# Patient Record
Sex: Female | Born: 1960 | Race: Black or African American | Hispanic: No | Marital: Married | Smoking: Never smoker
Health system: Southern US, Community
[De-identification: ages and names within clinical notes are randomized; demographics above are authoritative.]

## PROBLEM LIST (undated history)

## (undated) DIAGNOSIS — E119 Type 2 diabetes mellitus without complications: Secondary | ICD-10-CM

## (undated) DIAGNOSIS — I10 Essential (primary) hypertension: Secondary | ICD-10-CM

## (undated) HISTORY — PX: GASTROPLASTY DUODENAL SWITCH: SHX1699

---

## 2020-11-14 ENCOUNTER — Other Ambulatory Visit: Payer: Self-pay

## 2020-11-14 ENCOUNTER — Encounter: Payer: Self-pay | Admitting: Emergency Medicine

## 2020-11-14 ENCOUNTER — Ambulatory Visit
Admission: EM | Admit: 2020-11-14 | Discharge: 2020-11-14 | Disposition: A | Discharge status: Home / Self Care | Payer: BC Managed Care – PPO

## 2020-11-14 ENCOUNTER — Ambulatory Visit (INDEPENDENT_AMBULATORY_CARE_PROVIDER_SITE_OTHER): Payer: BC Managed Care – PPO

## 2020-11-14 DIAGNOSIS — M25561 Pain in right knee: Secondary | ICD-10-CM

## 2020-11-14 DIAGNOSIS — W19XXXA Unspecified fall, initial encounter: Secondary | ICD-10-CM

## 2020-11-14 DIAGNOSIS — M1710 Unilateral primary osteoarthritis, unspecified knee: Secondary | ICD-10-CM

## 2020-11-14 HISTORY — DX: Essential (primary) hypertension: I10

## 2020-11-14 HISTORY — DX: Type 2 diabetes mellitus without complications: E11.9

## 2020-11-14 MED ORDER — DICLOFENAC SODIUM 50 MG PO TBEC: 50.0000 mg | DELAYED_RELEASE_TABLET | Freq: Two times a day (BID) | ORAL | 0 refills | Status: AC

## 2020-11-14 NOTE — ED Provider Notes (Signed)
[facility]    CSN: [id number] Arrival date & time: 11/14/20  0803      History   Chief Complaint Chief Complaint  Patient presents with  . Knee Pain    right    HPI Shannon Roach is a 60 y.o. female presenting for right knee pain. She states that she fell in her yard yesterday when she tripped over a vine.  Patient is having diffuse pain across the anterior knee and some pain behind her knee.  She denies any numbness, weakness or tingling.  Patient states that she has increased pain when she tries to bear weight so she has been using a cane.  She has taken ibuprofen and Tylenol without any improvement in the pain.  Denies any underlying conditions of the need to her knowledge.  No other injuries, complaints or concerns.  HPI  Past Medical History:  Diagnosis Date  . Diabetes mellitus without complication (HCC)   . Hypertension     There are no problems to display for this patient.   Past Surgical History:  Procedure Laterality Date  . GASTROPLASTY DUODENAL SWITCH      OB History   No obstetric history on file.      Home Medications    Prior to Admission medications   Medication Sig Start Date End Date Taking? Authorizing Provider  atenolol (TENORMIN) 25 MG tablet Take 25 mg by mouth daily. 08/27/20  Yes [provider]  atorvastatin (LIPITOR) 40 MG tablet Take by mouth. 11/18/11  Yes [provider]  Cholecalciferol (VITAMIN D3) 50 MCG (2000 UT) capsule Take 2,000 Units by mouth daily.   Yes [provider]  diclofenac (VOLTAREN) 50 MG EC tablet Take 1 tablet (50 mg total) by mouth 2 (two) times daily for 10 days. 11/14/20 11/24/20 Yes Shirlee Latch, PA-C  folic acid (FOLVITE) 400 MCG tablet Take by mouth. 07/26/11  Yes [provider]  LEVEMIR 100 UNIT/ML injection SMARTSIG:5 Unit(s) SUB-Q Every Night 10/26/20  Yes [provider]  lisinopril-hydrochlorothiazide (ZESTORETIC) 20-12.5 MG tablet Take 1 tablet by  mouth every morning.   Yes [provider]  Multiple Vitamin (MULTIVITAMIN) tablet Take 1 tablet by mouth daily.   Yes [provider]    Family History History reviewed. No pertinent family history.  Social History Social History   Tobacco Use  . Smoking status: Never Smoker  . Smokeless tobacco: Never Used  Vaping Use  . Vaping Use: Never used  Substance Use Topics  . Alcohol use: Not Currently  . Drug use: Not Currently     Allergies   Patient has no known allergies.   Review of Systems Review of Systems  Musculoskeletal: Positive for arthralgias, gait problem and joint swelling.  Skin: Negative for color change and wound.  Neurological: Negative for weakness and numbness.     Physical Exam Triage Vital Signs ED Triage Vitals  Enc Vitals Group     BP 11/14/20 0824 134/68     Pulse Rate 11/14/20 0824 (!) 56     Resp 11/14/20 0824 18     Temp 11/14/20 0824 98.8 F (37.1 C)     Temp Source 11/14/20 0824 Oral     SpO2 11/14/20 0824 100 %     Weight 11/14/20 0819 200 lb (90.7 kg)     Height 11/14/20 0819 5\' 2"  (1.575 m)     Head Circumference --      Peak Flow --      Pain  Score 11/14/20 0819 8     Pain Loc --      Pain Edu? --      Excl. in GC? --    No data found.  Updated Vital Signs BP 134/68 (BP Location: Left Arm)   Pulse (!) 56   Temp 98.8 F (37.1 C) (Oral)   Resp 18   Ht 5\' 2"  (1.575 m)   Wt 200 lb (90.7 kg)   SpO2 100%   BMI 36.58 kg/m       Physical Exam Vitals and nursing note reviewed.  Constitutional:      General: She is not in acute distress.    Appearance: Normal appearance. She is not ill-appearing or toxic-appearing.  HENT:     Head: Normocephalic and atraumatic.     Nose: Nose normal.  Eyes:     General: No scleral icterus.       Right eye: No discharge.        Left eye: No discharge.     Conjunctiva/sclera: Conjunctivae normal.  Cardiovascular:     Rate and Rhythm: Regular rhythm. Bradycardia  present.     Heart sounds: Normal heart sounds.  Pulmonary:     Effort: Pulmonary effort is normal. No respiratory distress.     Breath sounds: Normal breath sounds.  Musculoskeletal:     Cervical back: Neck supple.     Comments: Right knee: There is mild to moderate swelling of the anterior knee.  Tenderness of the anterior joint line.  Full range of motion but does have increased pain with full flexion and full extension.  No obvious joint instability, but difficult to completely perform due to her guarding and pain.  Good strength and sensation.  Skin:    General: Skin is dry.  Neurological:     General: No focal deficit present.     Mental Status: She is alert. Mental status is at baseline.     Motor: No weakness.     Gait: Gait abnormal (Patient using cane).  Psychiatric:        Mood and Affect: Mood normal.        Behavior: Behavior normal.        Thought Content: Thought content normal.      UC Treatments / Results  Labs (all labs ordered are listed, but only abnormal results are displayed) Labs Reviewed - No data to display  EKG   Radiology DG Knee Complete 4 Views Right  Result Date: 11/14/2020 CLINICAL DATA:  Right knee pain after fall yesterday. EXAM: RIGHT KNEE - COMPLETE 4+ VIEW COMPARISON:  None. FINDINGS: No evidence of fracture, dislocation, or joint effusion. Moderate degenerative changes seen involving the patellofemoral and medial joint spaces. Soft tissues are unremarkable. IMPRESSION: Moderate degenerative joint disease. No acute abnormality seen in the right knee. Electronically Signed   By: 11/16/2020 M.D.   On: 11/14/2020 08:53    Procedures Procedures (including critical care time)  Medications Ordered in UC Medications - No data to display  Initial Impression / Assessment and Plan / UC Course  I have reviewed the triage vital signs and the nursing notes.  Pertinent labs & imaging results that were available during my care of the patient  were reviewed by me and considered in my medical decision making (see chart for details).   60 year old female presenting for right knee injury after falling onto her knees and hitting them on the ground yesterday.  X-ray today of the knee is negative for  any fractures.  Does show some osteoarthritis.  Independently reviewed the x-ray.  Reviewed the results with patient and husband.  Advised supportive care at this time with Tylenol for pain relief.  Also sent diclofenac sodium and advised she can use over-the-counter Voltaren gel if needed.  Encourage cryotherapy and elevation.  Knee brace provided.  Patient declined crutches stating she will use her cane.  Advised her to follow-up with orthopedics if she is not improving over the next week for any worsening symptoms to evaluate for possible ligament tear.   Final Clinical Impressions(s) / UC Diagnoses   Final diagnoses:  Acute pain of right knee  Arthritis of knee     Discharge Instructions     KNEE PAIN: X-ray is normal-nothing is fractured. Stressed avoiding painful activities . Reviewed RICE guidelines. Use medications as directed, including NSAIDs. If no NSAIDs have been prescribed for you today, you may take Aleve or Motrin over the counter. May use Tylenol in between doses of NSAIDs.  If no improvement in the next 1-2 weeks, f/u with PCP or return to our office for reexamination, and please feel free to call or return at any time for any questions or concerns you may have and we will be happy to help you!      You may have a condition requiring you to follow up with Orthopedics so please call one of the following office for appointment:   [facility] [address] [place], [place] [address] Phone: [phone]  [facility] [address], [place], [place] [address] Phone: [phone]     ED Prescriptions    Medication Sig Dispense Auth. Provider   diclofenac (VOLTAREN) 50 MG EC tablet Take 1 tablet (50 mg total) by  mouth 2 (two) times daily for 10 days. 20 tablet Shirlee Latch, PA-C     I have reviewed the PDMP during this encounter.   Shirlee Latch, PA-C 11/14/20 1036

## 2020-11-14 NOTE — Discharge Instructions (Addendum)
KNEE PAIN: X-ray is normal-nothing is fractured. Stressed avoiding painful activities . Reviewed RICE guidelines. Use medications as directed, including NSAIDs. If no NSAIDs have been prescribed for you today, you may take Aleve or Motrin over the counter. May use Tylenol in between doses of NSAIDs.  If no improvement in the next 1-2 weeks, f/u with PCP or return to our office for reexamination, and please feel free to call or return at any time for any questions or concerns you may have and we will be happy to help you!      You may have a condition requiring you to follow up with Orthopedics so please call one of the following office for appointment:   [facility] [address] [place], [place] [address] Phone: [phone]  [facility] [address], [place], [place] [address] Phone: [phone]

## 2020-11-14 NOTE — ED Triage Notes (Signed)
Pt c/o right knee pain. She states she fell yesterday in the yard. She tripped over a vine. She is walking with a cane and pain is worse when she bares weight. She has taken ibuprofen.

## 2021-04-09 ENCOUNTER — Emergency Department (HOSPITAL_COMMUNITY)
Admission: EM | Admit: 2021-04-09 | Discharge: 2021-04-09 | Disposition: A | Discharge status: Home / Self Care | Payer: BC Managed Care – PPO | Attending: Emergency Medicine | Admitting: Emergency Medicine

## 2021-04-09 ENCOUNTER — Encounter (HOSPITAL_COMMUNITY): Payer: Self-pay

## 2021-04-09 ENCOUNTER — Other Ambulatory Visit: Payer: Self-pay

## 2021-04-09 DIAGNOSIS — R040 Epistaxis: Secondary | ICD-10-CM

## 2021-04-09 DIAGNOSIS — E119 Type 2 diabetes mellitus without complications: Secondary | ICD-10-CM | POA: Diagnosis not present

## 2021-04-09 DIAGNOSIS — Z79899 Other long term (current) drug therapy: Secondary | ICD-10-CM | POA: Insufficient documentation

## 2021-04-09 DIAGNOSIS — I10 Essential (primary) hypertension: Secondary | ICD-10-CM | POA: Insufficient documentation

## 2021-04-09 MED ORDER — SILVER NITRATE-POT NITRATE 75-25 % EX MISC
1.0000 | Freq: Once | CUTANEOUS | Status: AC
  Administered 2021-04-09: 1 via TOPICAL
  Filled 2021-04-09: qty 20

## 2021-04-09 MED ORDER — OXYMETAZOLINE HCL 0.05 % NA SOLN
1.0000 | Freq: Once | NASAL | Status: AC
  Administered 2021-04-09: 1 via NASAL
  Filled 2021-04-09: qty 30

## 2021-04-09 MED ORDER — LIDOCAINE HCL 4 % EX SOLN
Freq: Once | CUTANEOUS | Status: AC
  Filled 2021-04-09: qty 50

## 2021-04-09 NOTE — ED Triage Notes (Signed)
Pt arrived via POV, c/o nose bleed since 1015. No blood thinners.   HR 48BPM in triage. Past records show HR similar rates.

## 2021-04-09 NOTE — ED Provider Notes (Signed)
[facility]-EMERGENCY DEPT Provider Note   CSN: [id number] Arrival date & time: 04/09/21  1104     History Chief Complaint  Patient presents with   Epistaxis    Shannon Roach is a 60 y.o. female.   Epistaxis Patient presents with nosebleed.  Comes from the right nostril.  An episode yesterday and again today.  Little bit coming down her posterior pharynx but mostly out the front.  No lightheadedness dizziness.  No other bleeding.  Not on anticoagulation.    Past Medical History:  Diagnosis Date   Diabetes mellitus without complication (HCC)    Hypertension     There are no problems to display for this patient.   Past Surgical History:  Procedure Laterality Date   GASTROPLASTY DUODENAL SWITCH       OB History   No obstetric history on file.     History reviewed. No pertinent family history.  Social History   Tobacco Use   Smoking status: Never   Smokeless tobacco: Never  Vaping Use   Vaping Use: Never used  Substance Use Topics   Alcohol use: Not Currently   Drug use: Not Currently    Home Medications Prior to Admission medications   Medication Sig Start Date End Date Taking? Authorizing Provider  atenolol (TENORMIN) 25 MG tablet Take 25 mg by mouth daily. 08/27/20   [provider]  atorvastatin (LIPITOR) 40 MG tablet Take by mouth. 11/18/11   [provider]  Cholecalciferol (VITAMIN D3) 50 MCG (2000 UT) capsule Take 2,000 Units by mouth daily.    [provider]  folic acid (FOLVITE) 400 MCG tablet Take by mouth. 07/26/11   [provider]  LEVEMIR 100 UNIT/ML injection SMARTSIG:5 Unit(s) SUB-Q Every Night 10/26/20   [provider]  lisinopril-hydrochlorothiazide (ZESTORETIC) 20-12.5 MG tablet Take 1 tablet by mouth every morning.    [provider]  Multiple Vitamin (MULTIVITAMIN) tablet Take 1 tablet by mouth daily.    [provider]    Allergies    Patient has no  known allergies.  Review of Systems   Review of Systems  Constitutional:  Negative for appetite change.  HENT:  Positive for nosebleeds.   Respiratory:  Negative for shortness of breath.   Musculoskeletal:  Negative for back pain.  Neurological:  Negative for light-headedness.  Hematological:  Negative for adenopathy. Does not bruise/bleed easily.  Psychiatric/Behavioral:  Negative for confusion.    Physical Exam Updated Vital Signs BP (!) 147/70 (BP Location: Left Arm)   Pulse 83   Temp 98 F (36.7 C) (Oral)   Resp 16   SpO2 100%   Physical Exam Vitals and nursing note reviewed.  HENT:     Head: Atraumatic.     Nose:     Comments: Area of previous bleeding from right anterior medial nostril.  No active bleeding. Eyes:     Pupils: Pupils are equal, round, and reactive to light.  Cardiovascular:     Rate and Rhythm: Regular rhythm.  Pulmonary:     Breath sounds: No wheezing.  Abdominal:     Tenderness: There is no abdominal tenderness.  Musculoskeletal:     Cervical back: Neck supple.  Skin:    General: Skin is warm.     Capillary Refill: Capillary refill takes less than 2 seconds.  Neurological:     Mental Status: She is alert and oriented to person, place, and time.    ED Results / Procedures / Treatments  Labs (all labs ordered are listed, but only abnormal results are displayed) Labs Reviewed - No data to display  EKG None  Radiology No results found.  Procedures .Epistaxis Management  Date/Time: 04/09/2021 5:00 PM Performed by: Benjiman Core, MD Authorized by: Benjiman Core, MD   Consent:    Consent obtained:  Verbal   Consent given by:  Patient   Risks, benefits, and alternatives were discussed: yes     Risks discussed:  Bleeding, nasal injury, pain and infection   Alternatives discussed:  Alternative treatment, no treatment, delayed treatment, observation and referral Anesthesia:    Anesthesia method:  Topical application   Topical  anesthetic:  Lidocaine gel Procedure details:    Treatment site:  R anterior   Treatment method:  Silver nitrate   Treatment complexity:  Limited   Treatment episode: initial   Post-procedure details:    Assessment:  Bleeding stopped   Procedure completion:  Tolerated well, no immediate complications   Medications Ordered in ED Medications  oxymetazoline (AFRIN) 0.05 % nasal spray 1 spray (1 spray Each Nare Given by Other 04/09/21 1526)  silver nitrate applicators applicator 1 Stick (1 Stick Topical Given by Other 04/09/21 1526)  lidocaine (XYLOCAINE) 4 % external solution ( Topical Given by Other 04/09/21 1526)    ED Course  I have reviewed the triage vital signs and the nursing notes.  Pertinent labs & imaging results that were available during my care of the patient were reviewed by me and considered in my medical decision making (see chart for details).    MDM Rules/Calculators/A&P                           Patient with epistaxis.  Right-sided anterior.  Doubt severe amount of bleeding.  Bleeding controlled with silver nitrate.  If needed follow-up with ENT.  Doubt severe anemia or doubt coagulopathy as the cause. Final Clinical Impression(s) / ED Diagnoses Final diagnoses:  Acute anterior epistaxis    Rx / DC Orders ED Discharge Orders     None        Benjiman Core, MD 04/10/21 0013

## 2021-04-09 NOTE — ED Provider Notes (Signed)
Emergency Medicine Provider Triage Evaluation Note  Jazzlyn Huizenga , a 60 y.o. female  was evaluated in triage.  Pt complains of history of hypertension diabetes.  Patient presents for right-sided epistaxis.  Patient had 30-minute episode of right-sided epistaxis yesterday evening which resolved spontaneously.  This morning around 10:05 AM patient reports recurrence of right-sided epistaxis, she has been intermittently holding pressure.  Review of Systems  Positive: Epistaxis Negative: Facial injury, facial pain, lightheadedness, chest pain/shortness of breath, syncope, blood thinner use  Physical Exam  BP (!) 160/115 (BP Location: Left Arm)   Pulse (!) 48   Temp 97.9 F (36.6 C) (Oral)   Resp 18   SpO2 100%  Gen:   Awake, no distress   Resp:  Normal effort  MSK:   Moves extremities without difficulty  Other:  Evidence of recent epistaxis in the right nares without active bleeding.  Oropharynx clear without blood.  Medical Decision Making  Medically screening exam initiated at 11:43 AM.  Appropriate orders placed.  Ronae Pennix was informed that the remainder of the evaluation will be completed by another provider, this initial triage assessment does not replace that evaluation, and the importance of remaining in the ED until their evaluation is complete.    Note: Portions of this report may have been transcribed using voice recognition software. Every effort was made to ensure accuracy; however, inadvertent computerized transcription errors may still be present.    Elizabeth Palau 04/09/21 1145    Ernie Avena, MD 04/09/21 1359

## 2022-04-11 IMAGING — CR DG KNEE COMPLETE 4+V*R*
4 series · 4 of 4 positions shown · non-contrast
Comparison: None.

CLINICAL DATA: Right knee pain after fall yesterday.

EXAM:
RIGHT KNEE - COMPLETE 4+ VIEW

[knee ap]
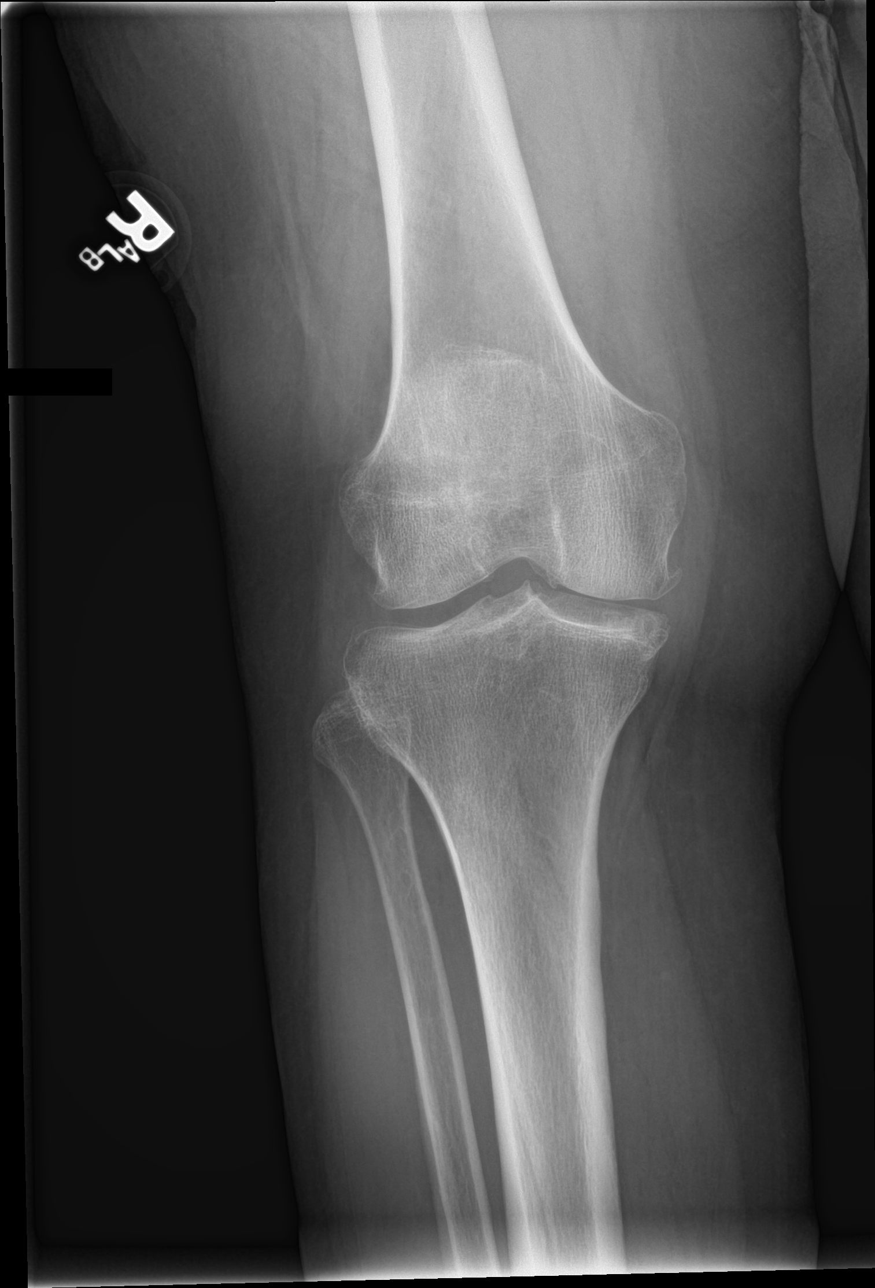

[knee lat]
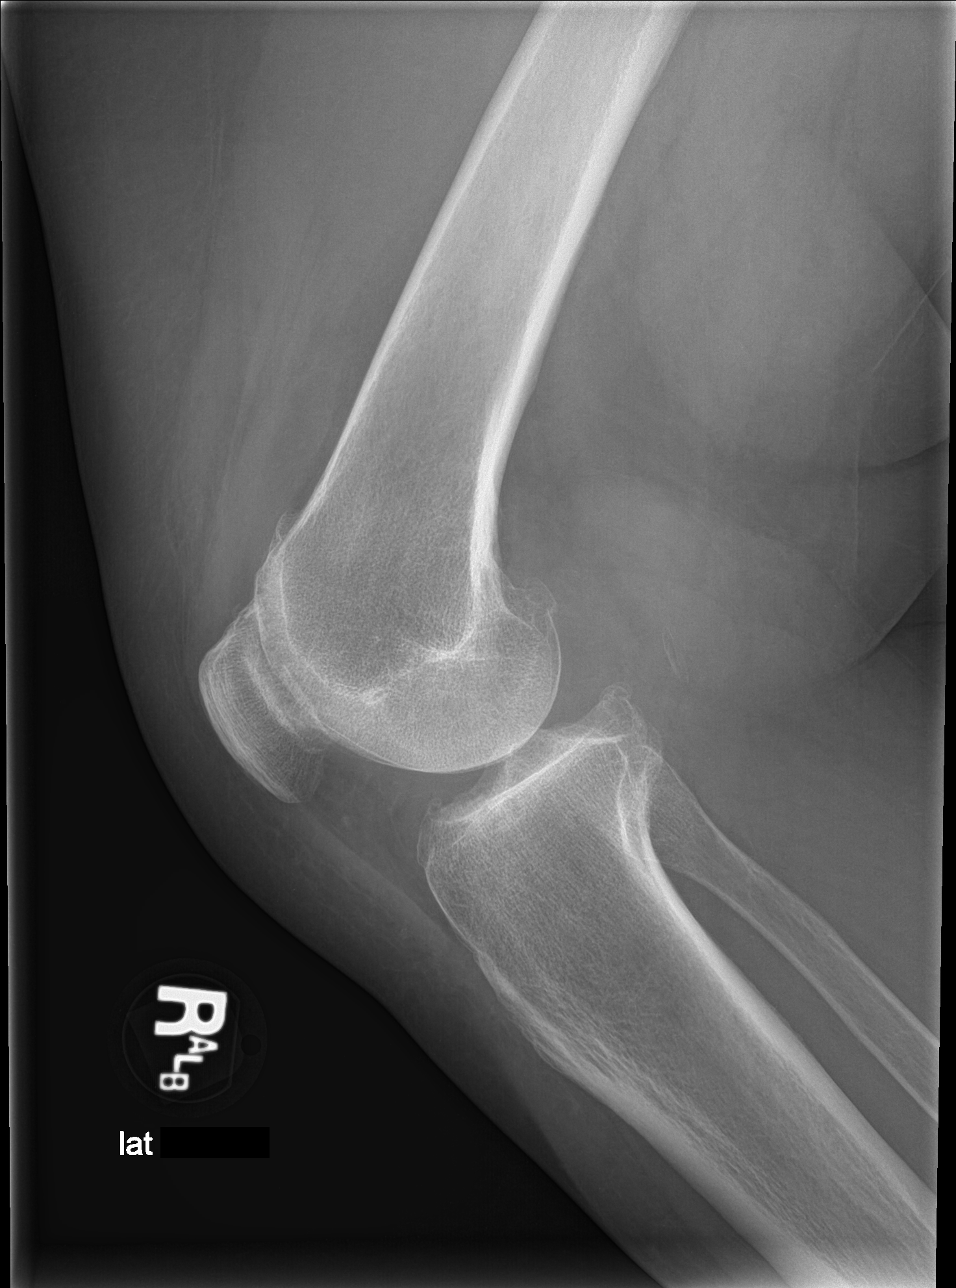

[tunnel]
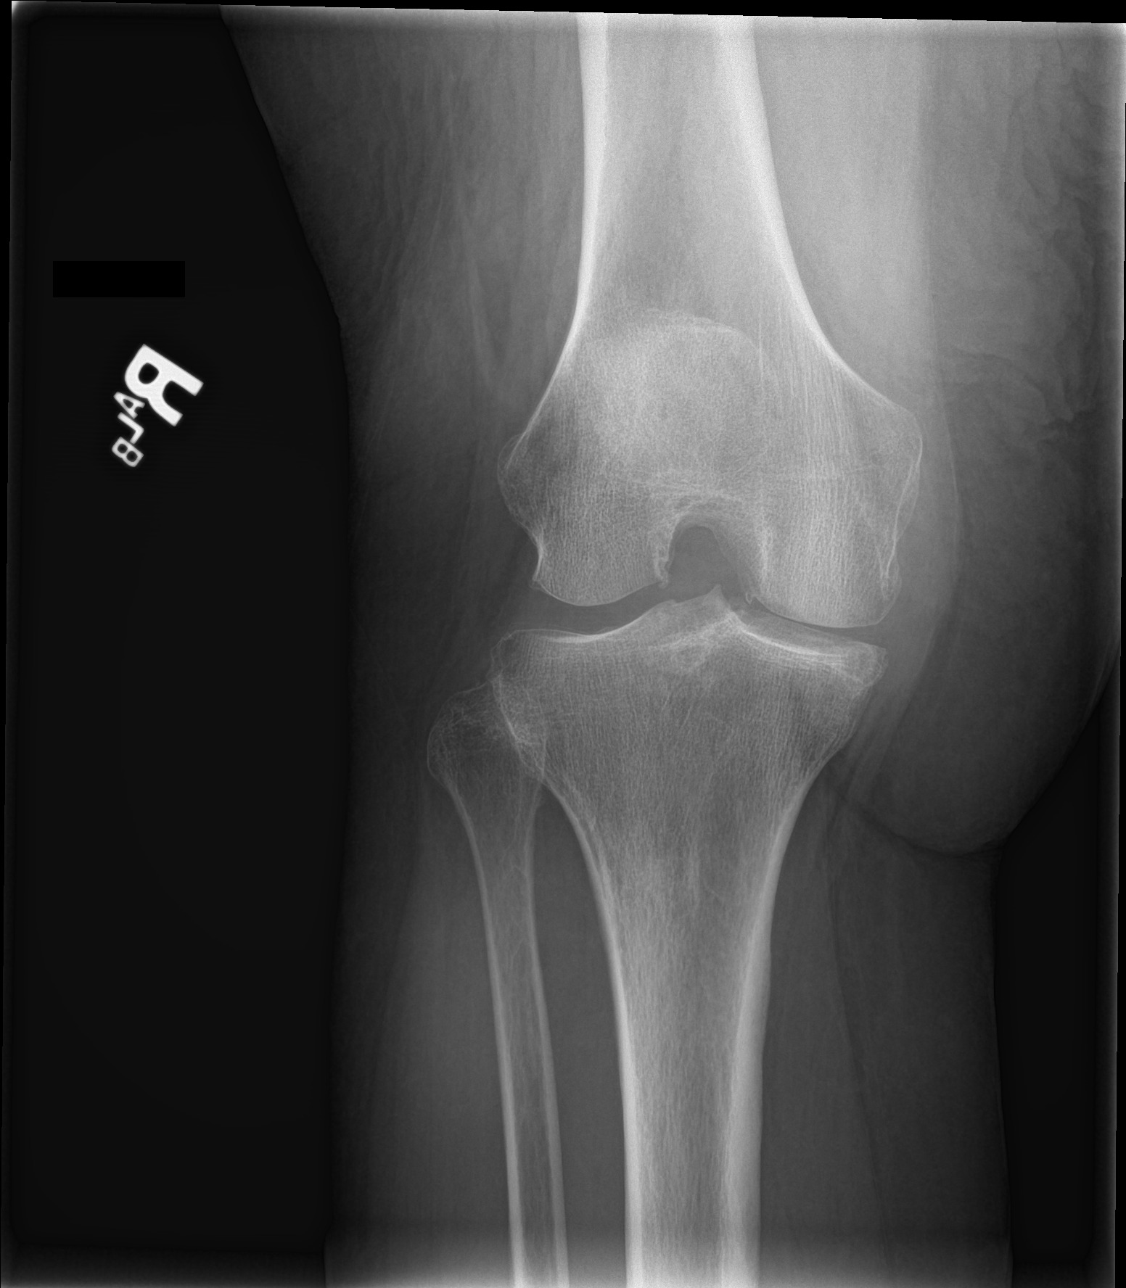

[patella skyline]
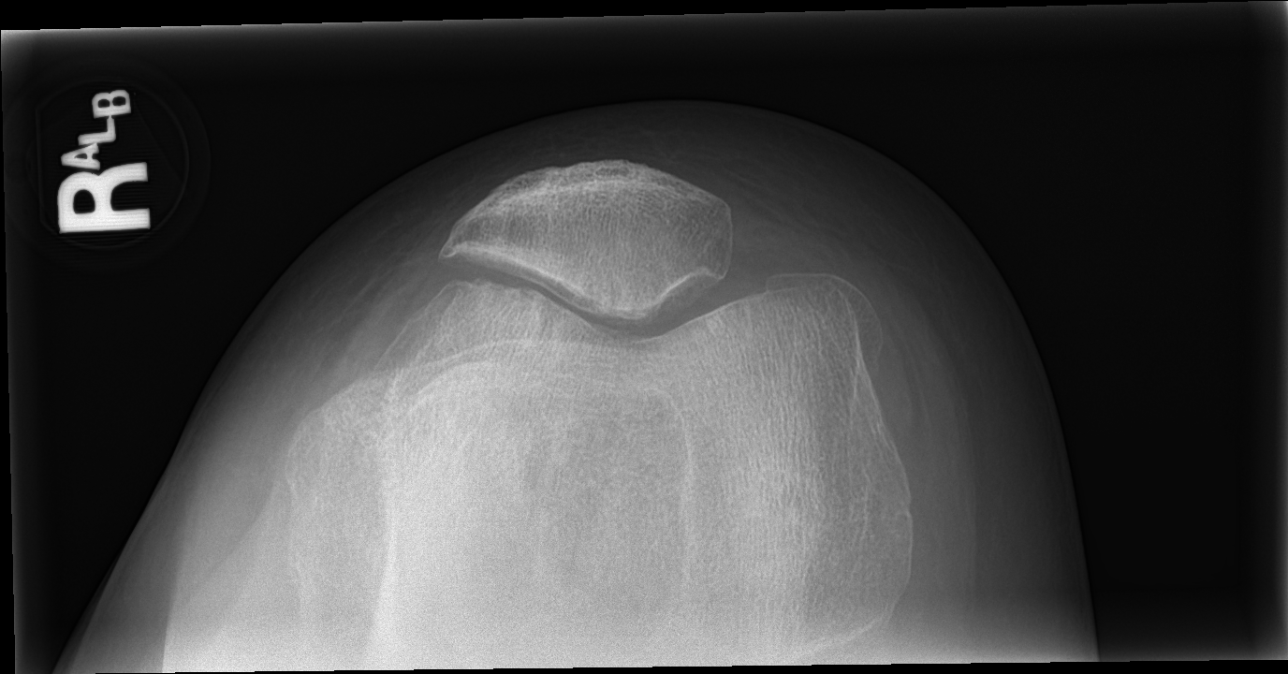

[4 of 4 positions shown; findings below may reference images not displayed]

FINDINGS: No evidence of fracture, dislocation, or joint effusion. Moderate
degenerative changes seen involving the patellofemoral and medial
joint spaces. Soft tissues are unremarkable.
IMPRESSION: Moderate degenerative joint disease. No acute abnormality seen in
the right knee.

## 2023-07-14 ENCOUNTER — Emergency Department (HOSPITAL_COMMUNITY): Payer: BC Managed Care – PPO

## 2023-07-14 ENCOUNTER — Other Ambulatory Visit: Payer: Self-pay

## 2023-07-14 ENCOUNTER — Emergency Department (HOSPITAL_COMMUNITY)
Admission: EM | Admit: 2023-07-14 | Discharge: 2023-07-14 | Disposition: A | Discharge status: Home / Self Care | Payer: BC Managed Care – PPO | Attending: Emergency Medicine | Admitting: Emergency Medicine

## 2023-07-14 ENCOUNTER — Encounter (HOSPITAL_COMMUNITY): Payer: Self-pay

## 2023-07-14 DIAGNOSIS — Y92410 Unspecified street and highway as the place of occurrence of the external cause: Secondary | ICD-10-CM | POA: Diagnosis not present

## 2023-07-14 DIAGNOSIS — S0003XA Contusion of scalp, initial encounter: Secondary | ICD-10-CM | POA: Insufficient documentation

## 2023-07-14 DIAGNOSIS — Z79899 Other long term (current) drug therapy: Secondary | ICD-10-CM | POA: Diagnosis not present

## 2023-07-14 DIAGNOSIS — M25511 Pain in right shoulder: Secondary | ICD-10-CM | POA: Diagnosis not present

## 2023-07-14 DIAGNOSIS — Z794 Long term (current) use of insulin: Secondary | ICD-10-CM | POA: Diagnosis not present

## 2023-07-14 DIAGNOSIS — I10 Essential (primary) hypertension: Secondary | ICD-10-CM | POA: Insufficient documentation

## 2023-07-14 DIAGNOSIS — E119 Type 2 diabetes mellitus without complications: Secondary | ICD-10-CM | POA: Diagnosis not present

## 2023-07-14 DIAGNOSIS — S0990XA Unspecified injury of head, initial encounter: Secondary | ICD-10-CM | POA: Diagnosis present

## 2023-07-14 MED ORDER — ACETAMINOPHEN 500 MG PO TABS
1000.0000 mg | ORAL_TABLET | Freq: Once | ORAL | Status: AC
  Administered 2023-07-14: 1000 mg via ORAL
  Filled 2023-07-14: qty 2

## 2023-07-14 MED ORDER — IBUPROFEN 400 MG PO TABS
400.0000 mg | ORAL_TABLET | Freq: Once | ORAL | Status: AC
  Administered 2023-07-14: 400 mg via ORAL
  Filled 2023-07-14: qty 1
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
## 2024-03-22 ENCOUNTER — Emergency Department (HOSPITAL_BASED_OUTPATIENT_CLINIC_OR_DEPARTMENT_OTHER): Payer: Self-pay

## 2024-03-22 ENCOUNTER — Encounter (HOSPITAL_BASED_OUTPATIENT_CLINIC_OR_DEPARTMENT_OTHER): Payer: Self-pay | Admitting: Emergency Medicine

## 2024-03-22 ENCOUNTER — Emergency Department (HOSPITAL_BASED_OUTPATIENT_CLINIC_OR_DEPARTMENT_OTHER)
Admission: EM | Admit: 2024-03-22 | Discharge: 2024-03-22 | Disposition: A | Discharge status: Home / Self Care | Payer: Worker's Compensation | Source: Ambulatory Visit | Attending: Emergency Medicine | Admitting: Emergency Medicine

## 2024-03-22 ENCOUNTER — Other Ambulatory Visit: Payer: Self-pay

## 2024-03-22 DIAGNOSIS — Y990 Civilian activity done for income or pay: Secondary | ICD-10-CM | POA: Insufficient documentation

## 2024-03-22 DIAGNOSIS — S20211A Contusion of right front wall of thorax, initial encounter: Secondary | ICD-10-CM | POA: Diagnosis not present

## 2024-03-22 DIAGNOSIS — W010XXA Fall on same level from slipping, tripping and stumbling without subsequent striking against object, initial encounter: Secondary | ICD-10-CM | POA: Diagnosis not present

## 2024-03-22 DIAGNOSIS — S299XXA Unspecified injury of thorax, initial encounter: Secondary | ICD-10-CM | POA: Diagnosis present

## 2024-03-22 DIAGNOSIS — S9031XA Contusion of right foot, initial encounter: Secondary | ICD-10-CM | POA: Diagnosis not present

## 2024-03-22 DIAGNOSIS — Z79899 Other long term (current) drug therapy: Secondary | ICD-10-CM | POA: Insufficient documentation

## 2024-03-22 DIAGNOSIS — Y92218 Other school as the place of occurrence of the external cause: Secondary | ICD-10-CM | POA: Insufficient documentation

## 2024-03-22 DIAGNOSIS — W19XXXA Unspecified fall, initial encounter: Secondary | ICD-10-CM

## 2024-03-22 NOTE — ED Provider Notes (Signed)
 [place] EMERGENCY DEPARTMENT AT [facility] Provider Note   CSN: [id number] Arrival date & time: 03/22/24  9062     Patient presents with: Shannon Roach   Shannon Roach is a 63 y.o. female.  Who presented to ED after a fall.  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  She was walking across the cafeteria floor which is wet after having just been clean and there was no caution sign posted.  She slipped on the wet floor and landed on her right side.  She did strike the back of her head on the floor and now has pain in her right ribs and right foot.  No shortness of breath abdominal pain or pain in other extremities.  No anticoagulation.    Fall       Prior to Admission medications   Medication Sig Start Date End Date Taking? Authorizing Provider  amLODipine (NORVASC) 5 MG tablet Take 5 mg by mouth at bedtime. 06/20/23   [provider]  atenolol (TENORMIN) 25 MG tablet Take 25 mg by mouth daily. 08/27/20   [provider]  atorvastatin (LIPITOR) 40 MG tablet Take by mouth. 11/18/11   [provider]  Cholecalciferol (VITAMIN D3) 50 MCG (2000 UT) capsule Take 2,000 Units by mouth daily.    [provider]  folic acid (FOLVITE) 400 MCG tablet Take by mouth. 07/26/11   [provider]  LANTUS SOLOSTAR 100 UNIT/ML Solostar Pen Inject 5 Units into the skin daily. 09/19/22   [provider]  LEVEMIR 100 UNIT/ML injection SMARTSIG:5 Unit(s) SUB-Q Every Night 10/26/20   [provider]  lisinopril-hydrochlorothiazide (ZESTORETIC) 20-12.5 MG tablet Take 1 tablet by mouth every morning.    [provider]  meloxicam (MOBIC) 15 MG tablet Take 15 mg by mouth daily. 03/27/23   [provider]  Multiple Vitamin (MULTIVITAMIN) tablet Take 1 tablet by mouth daily.    [provider]  Na Sulfate-K Sulfate-Mg Sulf 17.5-3.13-1.6 GM/177ML SOLN 1 (ONE) ML AS DIRECTED PER MD  INSTRUCTIONS 05/05/23   [provider]  ondansetron (ZOFRAN) 4 MG tablet Take 4 mg by mouth as directed. 05/05/23   [provider]    Allergies: Patient has no known allergies.    Review of Systems  Updated Vital Signs BP 121/64 (BP Location: Right Arm)   Pulse 68   Temp 98.3 F (36.8 C) (Oral)   Resp 15   SpO2 100%   Physical Exam Vitals and nursing note reviewed.  HENT:     Head: Normocephalic and atraumatic.  Eyes:     Pupils: Pupils are equal, round, and reactive to light.  Cardiovascular:     Rate and Rhythm: Normal rate and regular rhythm.  Pulmonary:     Effort: Pulmonary effort is normal.     Breath sounds: Normal breath sounds.  Abdominal:     Palpations: Abdomen is soft.     Tenderness: There is no abdominal tenderness.  Musculoskeletal:     Comments: Right lateral chest wall tenderness 5-5 motor strength throughout bilateral upper and lower extremities Tenderness to palpation over medial aspect of right foot and ankle without deformity  Skin:    General: Skin is warm and dry.  Neurological:     Mental Status: She is alert.  Psychiatric:        Mood and Affect: Mood normal.     (all labs ordered are listed, but only abnormal results are displayed) Labs  Reviewed - No data to display  EKG: None  Radiology: DG Ankle Complete Right Result Date: 03/22/2024 CLINICAL DATA:  Fall, pain EXAM: RIGHT ANKLE - COMPLETE 3+ VIEW COMPARISON:  None Available. FINDINGS: Mild diffuse soft tissue swelling. There is a lucency noted along the medial corner of the talar dome on the AP view with sclerotic bone fragment at the medial corner. This is most compatible with osteochondral lesion/defect and is likely chronic. No visible acute fracture. Mild degenerative changes in the right ankle, particularly medially. IMPRESSION: Medial lucency and sclerotic bone fragment medially within the talar dome, likely chronic osteochondral lesion/defect. Electronically  Signed   By: Franky Crease M.D.   On: 03/22/2024 12:11   DG Foot Complete Right Result Date: 03/22/2024 CLINICAL DATA:  Pain, fall EXAM: RIGHT FOOT COMPLETE - 3+ VIEW COMPARISON:  None Available. FINDINGS: Plantar calcaneal spur. Mild arthritic changes in the midfoot and hindfoot. No acute bony abnormality. Specifically, no fracture, subluxation, or dislocation. Soft tissues are intact. IMPRESSION: No acute bony abnormality. Electronically Signed   By: Franky Crease M.D.   On: 03/22/2024 12:07   CT Chest Wo Contrast Result Date: 03/22/2024 CLINICAL DATA:  Right side pain, question rib fracture.  Fall. EXAM: CT CHEST WITHOUT CONTRAST TECHNIQUE: Multidetector CT imaging of the chest was performed following the standard protocol without IV contrast. RADIATION DOSE REDUCTION: This exam was performed according to the departmental dose-optimization program which includes automated exposure control, adjustment of the mA and/or kV according to patient size and/or use of iterative reconstruction technique. COMPARISON:  Chest x-ray 07/14/2023 FINDINGS: Cardiovascular: Left pacer in place with lead in the right ventricle. Heart is normal size. Aorta is normal caliber. Mediastinum/Nodes: No mediastinal, hilar, or axillary adenopathy. Trachea and esophagus are unremarkable. Thyroid unremarkable. Lungs/Pleura: Lungs are clear. No focal airspace opacities or suspicious nodules. No effusions. No pneumothorax. Upper Abdomen: No acute findings Musculoskeletal: No acute bony abnormality.  No rib fracture. IMPRESSION: No acute cardiopulmonary disease. No visible rib fracture or pneumothorax. Electronically Signed   By: Franky Crease M.D.   On: 03/22/2024 12:06   CT Head Wo Contrast Result Date: 03/22/2024 EXAM: CT HEAD WITHOUT CONTRAST 03/22/2024 11:20:15 AM TECHNIQUE: CT of the head was performed without the administration of intravenous contrast. Automated exposure control, iterative reconstruction, and/or weight based  adjustment of the mA/kV was utilized to reduce the radiation dose to as low as reasonably achievable. COMPARISON: CT head 07/14/2023 CLINICAL HISTORY: Head trauma, moderate-severe. Triage notes: States fell while at work yesterday. Hit R side over ground. C/o R foot, R hip/flank pain, and R shoulder pain. Hit head. Denies LOC. No thinners. FINDINGS: BRAIN AND VENTRICLES: No acute hemorrhage. Gray-white differentiation is preserved. No hydrocephalus. No extra-axial collection. No mass effect or midline shift. ORBITS: No acute abnormality. SINUSES: No acute abnormality. SOFT TISSUES AND SKULL: No acute soft tissue abnormality. No skull fracture. Calcified atherosclerosis at the skull base. IMPRESSION: 1. No acute intracranial abnormality. Electronically signed by: Dasie Hamburg MD 03/22/2024 11:26 AM EDT RP Workstation: [id number]     Procedures   Medications Ordered in the ED - No data to display  Clinical Course as of 03/22/24 1319  Tue Mar 22, 2024  1311 No acute traumatic findings on CT head CT chest foot and ankle.  Patient resting comfortably here.  Instructed her on symptomatic management of likely rib contusion and musculoskeletal strain. [MP]    Clinical Course User Index [MP] Pamella Ozell LABOR, DO  Medical Decision Making 63 year old female presenting after mechanical fall at work.  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  Landed on her right side.  Positive head strike no LOC.  Now with pain in her right ribs and right foot.  We will need to obtain CT of the head and CT chest to evaluate for intracranial injury and rib fractures.  Will obtain x-ray of the right foot as well given pain and tenderness there.  Amount and/or Complexity of Data Reviewed Radiology: ordered.        Final diagnoses:  Contusion of rib on right side, initial encounter  Fall, initial encounter  Contusion of right foot, initial encounter    ED  Discharge Orders     None          Pamella Ozell LABOR, DO 03/22/24 1319

## 2024-03-22 NOTE — ED Notes (Signed)
 Patient requested nursing staff to call [facility] for [employer] paper work. Rn attempted to call and was unsuccessful. Patient told to follow up with employer.

## 2024-03-22 NOTE — ED Triage Notes (Signed)
 States fell while at work yesterday. Hit R side over ground. C/o R foot, R hip/flank pain., and R shoulder pain. Hit head. Denies LOC. No thinners.

## 2024-03-22 NOTE — Discharge Instructions (Signed)
 You were seen in the emergency room for injuries after fall The CAT scan taken of your head and ribs and the x-ray of your right foot looked okay There are no broken ribs or other broken bones Take Tylenol  Motrin  as directed for pain and inflammation Apply ice and heat packs Rest and perform light stretching
